# Patient Record
Sex: Male | Born: 1995 | Race: Black or African American | Hispanic: No | Marital: Single | Smoking: Never smoker
Health system: Southern US, Community
[De-identification: ages and names within clinical notes are randomized; demographics above are authoritative.]

---
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
## 2016-11-16 ENCOUNTER — Emergency Department
Admission: EM | Admit: 2016-11-16 | Discharge: 2016-11-17 | Disposition: A | Discharge status: Home / Self Care | Payer: Managed Care, Other (non HMO) | Attending: Emergency Medicine | Admitting: Emergency Medicine

## 2016-11-16 ENCOUNTER — Encounter: Payer: Self-pay | Admitting: Emergency Medicine

## 2016-11-16 ENCOUNTER — Emergency Department: Payer: Managed Care, Other (non HMO)

## 2016-11-16 DIAGNOSIS — Y999 Unspecified external cause status: Secondary | ICD-10-CM | POA: Diagnosis not present

## 2016-11-16 DIAGNOSIS — R4182 Altered mental status, unspecified: Secondary | ICD-10-CM | POA: Diagnosis present

## 2016-11-16 DIAGNOSIS — S060X1A Concussion with loss of consciousness of 30 minutes or less, initial encounter: Secondary | ICD-10-CM | POA: Diagnosis not present

## 2016-11-16 DIAGNOSIS — Y929 Unspecified place or not applicable: Secondary | ICD-10-CM | POA: Diagnosis not present

## 2016-11-16 DIAGNOSIS — W500XXA Accidental hit or strike by another person, initial encounter: Secondary | ICD-10-CM | POA: Diagnosis not present

## 2016-11-16 DIAGNOSIS — Y9367 Activity, basketball: Secondary | ICD-10-CM | POA: Insufficient documentation

## 2016-11-16 LAB — BASIC METABOLIC PANEL
Anion gap: 8 (ref 5–15)
BUN: 26 mg/dL — ABNORMAL HIGH (ref 6–20)
CALCIUM: 9.7 mg/dL (ref 8.9–10.3)
CO2: 25 mmol/L (ref 22–32)
CREATININE: 1.59 mg/dL — AB (ref 0.61–1.24)
Chloride: 105 mmol/L (ref 101–111)
GFR calc Af Amer: >60 mL/min (ref >60)
Glucose, Bld: 102 mg/dL — ABNORMAL HIGH (ref 65–99)
Potassium: 3.3 mmol/L — ABNORMAL LOW (ref 3.5–5.1)
SODIUM: 138 mmol/L (ref 135–145)

## 2016-11-16 LAB — CBC
HCT: 44 % (ref 40.0–52.0)
HEMOGLOBIN: 15.2 g/dL (ref 13.0–18.0)
MCH: 27 pg (ref 26.0–34.0)
MCHC: 34.5 g/dL (ref 32.0–36.0)
MCV: 78.4 fL — ABNORMAL LOW (ref 80.0–100.0)
PLATELETS: 222 10*3/uL (ref 150–440)
RBC: 5.62 MIL/uL (ref 4.40–5.90)
RDW: 13.3 % (ref 11.5–14.5)
WBC: 7.1 10*3/uL (ref 3.8–10.6)

## 2016-11-16 MED ORDER — ACETAMINOPHEN 500 MG PO TABS
1000.0000 mg | ORAL_TABLET | Freq: Once | ORAL | Status: AC
  Administered 2016-11-16: 1000 mg via ORAL
  Filled 2016-11-16: qty 2

## 2016-11-16 MED ORDER — SODIUM CHLORIDE 0.9 % IV BOLUS (SEPSIS)
1000.0000 mL | Freq: Once | INTRAVENOUS | Status: AC
  Administered 2016-11-16: 1000 mL via INTRAVENOUS

## 2016-11-16 MED ORDER — LORAZEPAM 2 MG/ML IJ SOLN
INTRAMUSCULAR | Status: AC
  Filled 2016-11-16: qty 1

## 2016-11-16 NOTE — Discharge Instructions (Addendum)
You were seen in the Emergency Department (ED) today for a head injury.  Based on your evaluation, you may have sustained a concussion (or bruise) to your brain.    Symptoms to expect from a concussion include nausea, mild to moderate headache, difficulty concentrating or sleeping, and mild lightheadedness.  These symptoms should improve over the next few days to weeks, but it may take many weeks before you feel back to normal.  Return to the emergency department or follow-up with your primary care doctor if your symptoms are not improving over this time.  Signs of a more serious head injury include vomiting, severe headache, excessive sleepiness or confusion, and weakness or numbness in your face, arms or legs.  Return immediately to the Emergency Department if you experience any of these more concerning symptoms.    Rest, avoid strenuous physical or mental activity, and avoid activities that could potentially result in another head injury until all your symptoms from this head injury are completely resolved for at least 2-3 weeks.  If you participate in sports, get cleared by your doctor or trainer before returning to play.  You may take ibuprofen or acetaminophen over the counter according to label instructions for mild headache or scalp soreness.  Please seek medical attention for any high fevers, chest pain, shortness of breath, change in behavior, persistent vomiting, bloody stool or any other new or concerning symptoms.

## 2016-11-16 NOTE — ED Provider Notes (Signed)
[facility] Emergency Department Provider Note   ____________________________________________   I have reviewed the triage vital signs and the nursing notes.   HISTORY  Chief Complaint Possible Concussion   History limited by: Altered Mental Status, history obtained from father   HPI Troy Spence is a 21 y.o. male who presents to the emergency department today after sports injury. The patient was playing basketball when he got elbowed in the head. Per the dad the patient went down to the ground and was unconscious for a couple minutes. He then was confused and able to speak normally. Mother states he's been mumbling since the injury. The injury happened about 1-1/2 hours prior to my evaluation. He also states that he was having a hard time walking. Patient himself is unable to give any history.   History reviewed. No pertinent past medical history.  There are no active problems to display for this patient.   History reviewed. No pertinent surgical history.  Prior to Admission medications   Medication Sig Start Date End Date Taking? Authorizing Provider  oxyCODONE-acetaminophen (PERCOCET/ROXICET) 5-325 MG tablet Take 1 tablet by mouth once. 11/10/15   Darci Currentandolph N Brown, MD    Allergies Patient has no known allergies.  History reviewed. No pertinent family history.  Social History Social History  Substance Use Topics  . Smoking status: Never Smoker  . Smokeless tobacco: Never Used  . Alcohol use No    Review of Systems Unable to obtain  ____________________________________________   PHYSICAL EXAM:  VITAL SIGNS: ED Triage Vitals [11/16/16 2119]  Enc Vitals Group     BP 126/76     Pulse 74     Resp 16     Temp 99.1     Temp src      SpO2 99     Weight 200 lb (90.7 kg)     Height 5\' 11"  (1.803 m)   Constitutional: Somnolent, wakes up to verbal stimuli, mouths single words. Eyes: Conjunctivae are normal. Normal extraocular  movements. ENT   Head: Normocephalic and atraumatic. No hematoma. No lacerations.   Nose: No congestion/rhinnorhea.   Mouth/Throat: Mucous membranes are moist.   Neck: No stridor. Hematological/Lymphatic/Immunilogical: No cervical lymphadenopathy. Cardiovascular: Normal rate, regular rhythm.  No murmurs, rubs, or gallops.  Respiratory: Normal respiratory effort without tachypnea nor retractions. Breath sounds are clear and equal bilaterally. No wheezes/rales/rhonchi. Gastrointestinal: Soft and non tender. No rebound. No guarding.  Genitourinary: Deferred Musculoskeletal: Normal range of motion in all extremities. No lower extremity edema. Neurologic:  Somnolent, wakes to verbal stimuli. Moves all extremities. Sensation intact.  Skin:  Skin is warm, dry and intact. No rash noted.   ____________________________________________    LABS (pertinent positives/negatives)  Cr 1.59  ____________________________________________   EKG  None  ____________________________________________    RADIOLOGY  CT head IMPRESSION: No acute intracranial abnormalities.   ____________________________________________   PROCEDURES  Procedures  ____________________________________________   INITIAL IMPRESSION / ASSESSMENT AND PLAN / ED COURSE  Pertinent labs & imaging results that were available during my care of the patient were reviewed by me and considered in my medical decision making (see chart for details).  Patient presented to the emergency department today after head trauma. Patient unable to state name. Will open eyes to verbal stimuli however has a hard time tracking. Given concern for head injury will obtain CT scan.  CT scan negative for acute findings. Blood work concerning for dehydration. ----------------------------------------- 11:36 PM on 11/16/2016 -----------------------------------------  Patient continues to gradually  improve. The emergency  department. He is now able to speak sentences. States he has a mild headache. Will give Tylenol. ____________________________________________   FINAL CLINICAL IMPRESSION(S) / ED DIAGNOSES  Final diagnoses:  Concussion with loss of consciousness of 30 minutes or less, initial encounter     Note: This dictation was prepared with Dragon dictation. Any transcriptional errors that result from this process are unintentional     Troy Semen, MD 11/17/16 1551

## 2016-11-16 NOTE — ED Triage Notes (Signed)
Per pt's dad he was playing basketball and ran into a defending player with his elbows out while trying to put out a screen.  Patient took elbow across the bridge of the nose and became dizzy and lethargic at courtside.  Pt was retrieved from his car by 6 staff members and put onto a stretcher outside and taken to room 3.  MD was present during triage and patient was then taken to CT for head scan, with MD at patient's side.  Pt was unable to initially speak when he arrived here but is becoming more orietned to voice and is currently resting with his eyes closed.

## 2016-11-17 LAB — URINALYSIS, COMPLETE (UACMP) WITH MICROSCOPIC
Bilirubin Urine: NEGATIVE
Glucose, UA: NEGATIVE mg/dL
Hgb urine dipstick: NEGATIVE
Ketones, ur: 5 mg/dL — AB
Leukocytes, UA: NEGATIVE
Nitrite: NEGATIVE
Protein, ur: 30 mg/dL — AB
SPECIFIC GRAVITY, URINE: 1.031 — AB (ref 1.005–1.030)
pH: 5 (ref 5.0–8.0)

## 2016-11-17 LAB — CK: CK TOTAL: 369 U/L (ref 49–397)

## 2016-11-17 MED ORDER — SODIUM CHLORIDE 0.9 % IV BOLUS (SEPSIS)
1000.0000 mL | INTRAVENOUS | Status: AC
  Administered 2016-11-17: 1000 mL via INTRAVENOUS

## 2016-11-17 NOTE — ED Provider Notes (Signed)
Clinical Course as of Nov 17 802  Sat Nov 17, 2016  0002 Assuming care from Dr. Derrill KayGoodman.  In short, Troy Spence is a 21 y.o. male with a chief complaint of head injury.  Refer to the original H&P for additional details.  The current plan of care is to continue to monitor for improvement after his head injury.   [CF]  0308 Patient ambulated to bathroom without difficulty, but his urine is very dark even after 1L NS.  Given his elevated creatinine and dark urine, I will give a second liter bolus and we will send a CK to make sure he is not in rhabdo.  Improving mental status is reassuring.  [CF]  0346 CK normal, patient is feeling better.  Will d/c with his father.  [CF]    Clinical Course User Index [CF] Troy Roseory Jacquese Cassarino, MD      Troy Roseory Ozella Comins, MD 11/17/16 [phone]

## 2018-05-14 ENCOUNTER — Ambulatory Visit (HOSPITAL_COMMUNITY)
Admission: EM | Admit: 2018-05-14 | Discharge: 2018-05-14 | Discharge status: Absent without leave | Payer: Managed Care, Other (non HMO)

## 2018-07-28 ENCOUNTER — Other Ambulatory Visit: Payer: Self-pay

## 2018-07-28 ENCOUNTER — Encounter: Payer: Self-pay | Admitting: Intensive Care

## 2018-07-28 ENCOUNTER — Emergency Department
Admission: EM | Admit: 2018-07-28 | Discharge: 2018-07-28 | Disposition: A | Discharge status: Home / Self Care | Payer: Managed Care, Other (non HMO) | Attending: Emergency Medicine | Admitting: Emergency Medicine

## 2018-07-28 DIAGNOSIS — R21 Rash and other nonspecific skin eruption: Secondary | ICD-10-CM | POA: Insufficient documentation

## 2018-07-28 LAB — GLUCOSE, CAPILLARY: Glucose-Capillary: 101 mg/dL — ABNORMAL HIGH (ref 70–99)

## 2018-07-28 NOTE — ED Triage Notes (Signed)
Patient has swelling of bilateral feet with rashes. Patient also has blisters forming all over body and trunk. He reports having this problem before but unsure what it was diagnosed as

## 2018-07-28 NOTE — Discharge Instructions (Addendum)
Please seek medical attention for any high fevers, chest pain, shortness of breath, change in behavior, persistent vomiting, bloody stool or any other new or concerning symptoms.  

## 2018-07-28 NOTE — ED Provider Notes (Signed)
[facility] Emergency Department Provider Note  _______________________________________   I have reviewed the triage vital signs and the nursing notes.   HISTORY  Chief Complaint Rash   History limited by: Not Limited   HPI Troy Spence is a 22 y.o. male who presents to the emergency department today because of concern for a rash. The patient has had a rash to his bilateral feet, extremities and trunk. His feet have been somewhat painful and swollen although the other lesions have essentially been painless. Went to urgent care where they started him on antifungal medication and steroids. He has seen some improvement in his foot lesions. The patient denies any fevers, nausea or vomiting. He states he has had similar lesions on his feet once before.    History reviewed. No pertinent past medical history.  There are no active problems to display for this patient.   History reviewed. No pertinent surgical history.  Prior to Admission medications   Not on File    Allergies Patient has no known allergies.  History reviewed. No pertinent family history.  Social History Social History   Tobacco Use  . Smoking status: Never Smoker  . Smokeless tobacco: Never Used  Substance Use Topics  . Alcohol use: No  . Drug use: Not on file    Review of Systems Constitutional: No fever/chills Eyes: No visual changes. ENT: No sore throat. Cardiovascular: Denies chest pain. Respiratory: Denies shortness of breath. Gastrointestinal: No abdominal pain.  No nausea, no vomiting.  No diarrhea.   Genitourinary: Negative for dysuria. Musculoskeletal: Negative for back pain. Positive for foot swelling.  Skin: Positive for rash.  Neurological: Negative for headaches, focal weakness or numbness.  ____________________________________________   PHYSICAL EXAM:  VITAL SIGNS: ED Triage Vitals  Enc Vitals Group     BP 07/28/18 1509 (!) 147/68     Pulse Rate  07/28/18 1509 72     Resp 07/28/18 1509 16     Temp 07/28/18 1509 98 F (36.7 C)     Temp Source 07/28/18 1509 Oral     SpO2 07/28/18 1509 98 %     Weight 07/28/18 1509 213 lb (96.6 kg)     Height 07/28/18 1509 5\' 11"  (1.803 m)     Head Circumference --      Peak Flow --      Pain Score 07/28/18 1518 0   Constitutional: Alert and oriented.  Eyes: Conjunctivae are normal.  ENT      Head: Normocephalic and atraumatic.      Nose: No congestion/rhinnorhea.      Mouth/Throat: Mucous membranes are moist.      Neck: No stridor. Hematological/Lymphatic/Immunilogical: No cervical lymphadenopathy. Cardiovascular: Normal rate, regular rhythm.  No murmurs, rubs, or gallops. Respiratory: Normal respiratory effort without tachypnea nor retractions. Breath sounds are clear and equal bilaterally. No wheezes/rales/rhonchi. Gastrointestinal: Soft and non tender. No rebound. No guarding.  Genitourinary: Deferred Musculoskeletal: Normal range of motion in all extremities.  Neurologic:  Normal speech and language. No gross focal neurologic deficits are appreciated.  Skin:  Defuse urticarial rash with some crusting. Larger more confluent rash to the dorsum of the feet, right greater than left. No surrounding erythema.  Psychiatric: Mood and affect are normal. Speech and behavior are normal. Patient exhibits appropriate insight and judgment.  ____________________________________________    LABS (pertinent positives/negatives)  Blood glucose 101  ____________________________________________   EKG  None  ____________________________________________    RADIOLOGY  None  ____________________________________________   PROCEDURES  Procedures  ____________________________________________   INITIAL IMPRESSION / ASSESSMENT AND PLAN / ED COURSE  Pertinent labs & imaging results that were available during my care of the patient were reviewed by me and considered in my medical decision  making (see chart for details).   Patient presented to the emergency department today because of concerns for rash.  In terms of his rash I do wonder if 2 different processes are occurring.  In terms of his foot rash I do think a severe case of athlete's foot is possible.  It does appear that it is responding to the medication.  However for the urticarial rash I am not so sure.  Does have some lesions on his palms however he denies any sexual activity in the past year.  Patient denies any other systemic illness that would make me concerned for secondary syphilis.  Discussed with the patient.  At this point unclear if any blood work here in the emergency department would be of value.  Do think he would benefit from dermatology evaluation.  Discussed this with the patient.  Will give dermatology follow-up.  ____________________________________________   FINAL CLINICAL IMPRESSION(S) / ED DIAGNOSES  Final diagnoses:  Rash     Note: This dictation was prepared with Dragon dictation. Any transcriptional errors that result from this process are unintentional     [name], MD 07/28/18 1934

## 2018-07-28 NOTE — ED Notes (Signed)

## 2020-06-22 ENCOUNTER — Emergency Department: Payer: Managed Care, Other (non HMO)

## 2020-06-22 ENCOUNTER — Emergency Department
Admission: EM | Admit: 2020-06-22 | Discharge: 2020-06-22 | Disposition: A | Discharge status: Home / Self Care | Payer: Managed Care, Other (non HMO) | Attending: Emergency Medicine | Admitting: Emergency Medicine

## 2020-06-22 ENCOUNTER — Other Ambulatory Visit: Payer: Self-pay

## 2020-06-22 DIAGNOSIS — Y9289 Other specified places as the place of occurrence of the external cause: Secondary | ICD-10-CM | POA: Insufficient documentation

## 2020-06-22 DIAGNOSIS — S161XXA Strain of muscle, fascia and tendon at neck level, initial encounter: Secondary | ICD-10-CM | POA: Insufficient documentation

## 2020-06-22 DIAGNOSIS — S199XXA Unspecified injury of neck, initial encounter: Secondary | ICD-10-CM | POA: Diagnosis present
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
## 2021-02-07 ENCOUNTER — Other Ambulatory Visit: Payer: Self-pay | Admitting: Family Medicine

## 2021-02-07 ENCOUNTER — Other Ambulatory Visit: Payer: Self-pay

## 2021-02-07 ENCOUNTER — Ambulatory Visit: Payer: Self-pay

## 2021-02-07 DIAGNOSIS — M25531 Pain in right wrist: Secondary | ICD-10-CM

## 2021-10-28 ENCOUNTER — Emergency Department
Admission: EM | Admit: 2021-10-28 | Discharge: 2021-10-29 | Disposition: A | Discharge status: Home / Self Care | Payer: Managed Care, Other (non HMO) | Attending: Emergency Medicine | Admitting: Emergency Medicine

## 2021-10-28 DIAGNOSIS — R0602 Shortness of breath: Secondary | ICD-10-CM | POA: Insufficient documentation

## 2021-10-28 DIAGNOSIS — R55 Syncope and collapse: Secondary | ICD-10-CM | POA: Insufficient documentation

## 2021-10-28 DIAGNOSIS — R42 Dizziness and giddiness: Secondary | ICD-10-CM | POA: Diagnosis present

## 2021-10-29 ENCOUNTER — Encounter: Payer: Self-pay | Admitting: Radiology

## 2021-10-29 ENCOUNTER — Other Ambulatory Visit: Payer: Self-pay

## 2021-10-29 ENCOUNTER — Emergency Department: Payer: Managed Care, Other (non HMO)

## 2021-10-29 LAB — CBC WITH DIFFERENTIAL/PLATELET
Abs Immature Granulocytes: 0.02 10*3/uL (ref 0.00–0.07)
Basophils Absolute: 0 10*3/uL (ref 0.0–0.1)
Basophils Relative: 1 %
Eosinophils Absolute: 0.1 10*3/uL (ref 0.0–0.5)
Eosinophils Relative: 2 %
HCT: 40.4 % (ref 39.0–52.0)
Hemoglobin: 14 g/dL (ref 13.0–17.0)
Immature Granulocytes: 1 %
Lymphocytes Relative: 41 %
Lymphs Abs: 1.7 10*3/uL (ref 0.7–4.0)
MCH: 28.3 pg (ref 26.0–34.0)
MCHC: 34.7 g/dL (ref 30.0–36.0)
MCV: 81.6 fL (ref 80.0–100.0)
Monocytes Absolute: 0.3 10*3/uL (ref 0.1–1.0)
Monocytes Relative: 7 %
Neutro Abs: 2.1 10*3/uL (ref 1.7–7.7)
Neutrophils Relative %: 48 %
Platelets: 203 10*3/uL (ref 150–400)
RBC: 4.95 MIL/uL (ref 4.22–5.81)
RDW: 12.9 % (ref 11.5–15.5)
WBC: 4.3 10*3/uL (ref 4.0–10.5)
nRBC: 0 % (ref 0.0–0.2)

## 2021-10-29 LAB — COMPREHENSIVE METABOLIC PANEL
ALT: 16 U/L (ref 0–44)
AST: 16 U/L (ref 15–41)
Albumin: 3.8 g/dL (ref 3.5–5.0)
Alkaline Phosphatase: 69 U/L (ref 38–126)
Anion gap: 4 — ABNORMAL LOW (ref 5–15)
BUN: 16 mg/dL (ref 6–20)
CO2: 25 mmol/L (ref 22–32)
Calcium: 8.9 mg/dL (ref 8.9–10.3)
Chloride: 107 mmol/L (ref 98–111)
Creatinine, Ser: 1.01 mg/dL (ref 0.61–1.24)
GFR, Estimated: >60 mL/min (ref >60)
Glucose, Bld: 104 mg/dL — ABNORMAL HIGH (ref 70–99)
Potassium: 3.7 mmol/L (ref 3.5–5.1)
Sodium: 136 mmol/L (ref 135–145)
Total Bilirubin: 0.6 mg/dL (ref 0.3–1.2)
Total Protein: 6.4 g/dL — ABNORMAL LOW (ref 6.5–8.1)

## 2021-10-29 LAB — TROPONIN I (HIGH SENSITIVITY)
Troponin I (High Sensitivity): <2 ng/L (ref <18)
Troponin I (High Sensitivity): 3 ng/L (ref <18)

## 2021-10-29 LAB — D-DIMER, QUANTITATIVE: D-Dimer, Quant: <0.27 ug/mL-FEU (ref 0.00–0.50)

## 2021-10-29 LAB — CBG MONITORING, ED: Glucose-Capillary: 103 mg/dL — ABNORMAL HIGH (ref 70–99)

## 2021-10-29 MED ORDER — SODIUM CHLORIDE 0.9 % IV BOLUS (SEPSIS)
1000.0000 mL | Freq: Once | INTRAVENOUS | Status: AC
Start: 2021-10-29 — End: 2021-10-29
  Administered 2021-10-29: 1000 mL via INTRAVENOUS

## 2021-10-29 NOTE — ED Notes (Signed)
Pr requested to use toilet in room to urinate and needed ambulation trial per EDP request. Pt was able to ambulate to toilet in room- denied any dizziness, lightheadedness, etc. Pt sat on side of bed for approx 30 seconds and then stood approx 30 seconds. Pt was steady on feet and only needed mild assistance to toilet and then was able to walk back to bed unassisted and with steady gait. Pt reported he is "feeling a lot better." EDP updated. Pt also reported he does not live alone and his dad is at home if he needed assistance.

## 2021-10-29 NOTE — ED Notes (Addendum)
Pt requested apple sauce and apple juice when informed he could try eating and drinking per EDP.

## 2021-10-29 NOTE — ED Provider Notes (Signed)
[facility] Provider Note    Event Date/Time   First MD Initiated Contact with Patient 10/29/21 0002     (approximate)   History   Seizures   HPI  Troy Spence is a 26 y.o. male with no significant past medical history who presents to the emergency department with EMS after he had a syncopal event at work.  Patient states this is happened to him once before when he was working and got "overheated".  He states that he was standing and suddenly felt lightheaded and short of breath.  He passed out but denies any injury from his fall.  No headache, neck or back pain.  No numbness, tingling or weakness.  Not on blood thinners.  He denies having any chest pain prior to the syncopal event or afterwards.  No witnessed seizure-like activity.  He was not postictal.  Normal blood glucose and vital signs with EMS.  Patient denies any bloody stools or melena.  Reports he has been eating and drinking well.  No fevers, cough, vomiting or diarrhea.  No previous cardiac history.  No history of PE or DVT.   History provided by patient, EMS.    History reviewed. No pertinent past medical history.  No past surgical history on file.  MEDICATIONS:  Prior to Admission medications   Not on File    Physical Exam   Triage Vital Signs: ED Triage Vitals [10/28/21 2357]  Enc Vitals Group     BP 123/86     Pulse Rate 67     Resp 18     Temp 98.6 F (37 C)     Temp src      SpO2 97 %     Weight 236 lb (107 kg)     Height 6' (1.829 m)     Head Circumference      Peak Flow      Pain Score      Pain Loc      Pain Edu?      Excl. in [place]?     Most recent vital signs: Vitals:   10/29/21 0130 10/29/21 0200  BP: 110/74 110/71  Pulse: (!) 57 (!) 57  Resp: 15 15  Temp:    SpO2: 94% 95%    CONSTITUTIONAL: Alert and oriented and responds appropriately to questions. Well-appearing; well-nourished HEAD: Normocephalic, atraumatic EYES: Conjunctivae clear, pupils appear  equal, sclera nonicteric ENT: normal nose; moist mucous membranes NECK: Supple, normal ROM, no midline spinal tenderness or step-off or deformity CARD: RRR; S1 and S2 appreciated; no murmurs, no clicks, no rubs, no gallops RESP: Normal chest excursion without splinting or tachypnea; breath sounds clear and equal bilaterally; no wheezes, no rhonchi, no rales, no hypoxia or respiratory distress, speaking full sentences ABD/GI: Normal bowel sounds; non-distended; soft, non-tender, no rebound, no guarding, no peritoneal signs BACK: The back appears normal, no midline spinal tenderness or step-off or deformity EXT: Normal ROM in all joints; no deformity noted, no edema; no cyanosis SKIN: Normal color for age and race; warm; no rash on exposed skin NEURO: Moves all extremities equally, normal speech, normal sensation diffusely, no facial asymmetry PSYCH: The patient's mood and manner are appropriate.   ED Results / Procedures / Treatments   LABS: (all labs ordered are listed, but only abnormal results are displayed) Labs Reviewed  COMPREHENSIVE METABOLIC PANEL - Abnormal; Notable for the following components:      Result Value   Glucose, Bld 104 (*)  Total Protein 6.4 (*)    Anion gap 4 (*)    All other components within normal limits  CBG MONITORING, ED - Abnormal; Notable for the following components:   Glucose-Capillary 103 (*)    All other components within normal limits  CBC WITH DIFFERENTIAL/PLATELET  D-DIMER, QUANTITATIVE (NOT AT [facility])  TROPONIN I (HIGH SENSITIVITY)  TROPONIN I (HIGH SENSITIVITY)     EKG:  EKG Interpretation  Date/Time:  Saturday October 28 2021 23:56:27 EST Ventricular Rate:  57 PR Interval:  204 QRS Duration: 94 QT Interval:  408 QTC Calculation: 398 R Axis:   91 Text Interpretation: Sinus rhythm Borderline prolonged PR interval Borderline right axis deviation Borderline T wave abnormalities ST elevation, consider lateral injury Confirmed by Pryor Curia [phone]) on 10/29/2021 12:14:52 AM         RADIOLOGY: My personal review and interpretation of imaging: Chest x-ray shows no infiltrate, edema, cardiomegaly, widened mediastinum, pneumothorax.  I have personally reviewed all radiology reports.   DG Chest 2 View  Result Date: 10/29/2021 CLINICAL DATA:  Shortness of breath EXAM: CHEST - 2 VIEW COMPARISON:  None. FINDINGS: The heart size and mediastinal contours are within normal limits. Both lungs are clear. The visualized skeletal structures are unremarkable. IMPRESSION: No active cardiopulmonary disease. Electronically Signed   By: Ulyses Jarred M.D.   On: 10/29/2021 03:13     PROCEDURES:  Critical Care performed: No     .1-3 Lead EKG Interpretation Performed by: Fernande Treiber, Delice Bison, DO Authorized by: Myrella Fahs, Delice Bison, DO     Interpretation: normal     ECG rate:  60   ECG rate assessment: normal     Rhythm: sinus rhythm     Ectopy: none     Conduction: normal      IMPRESSION / MDM / ASSESSMENT AND PLAN / ED COURSE  I reviewed the triage vital signs and the nursing notes.    Patient here after a syncopal event.  Felt lightheaded and short of breath prior to passing out.  No witnessed seizure activity.  The patient is on the cardiac monitor to evaluate for evidence of arrhythmia and/or significant heart rate changes.   DIFFERENTIAL DIAGNOSIS (includes but not limited to):   Vasovagal syncope, less likely ACS, PE, dissection, pneumothorax, CHF, pneumonia.  Differential also includes anemia, electrolyte derangement, dehydration, orthostasis.  Episode today does not sound like a seizure.   PLAN: CBC, CMP, troponin, D-dimer, chest x-ray, EKG, IV fluids.   MEDICATIONS GIVEN IN ED: Medications  sodium chloride 0.9 % bolus 1,000 mL (0 mLs Intravenous Stopped 10/29/21 0220)     ED COURSE: Patient's labs are reassuring.  Normal hemoglobin, electrolytes.  Troponin negative x2.  D-dimer negative.  Chest x-ray reviewed by  myself and radiologist is clear without edema, infiltrate, pneumothorax.  EKG shows no significant abnormality.  I do not appreciate any ischemia, interval abnormality or arrhythmia.  He has been monitored on cardiac monitoring without any acute events.  Has been in only bradycardic into the 50s which I suspect is his baseline.  He has been able to tolerate p.o. here and ambulate without difficulty.  I feel he is safe for discharge home with close outpatient follow-up.  Patient comfortable with this plan.  I suspect today's episode was vasovagal in nature.   At this time, I do not feel there is any life-threatening condition present. I reviewed all nursing notes, vitals, pertinent previous records.  All lab and urine results, EKGs, imaging ordered have  been independently reviewed and interpreted by myself.  I reviewed all available radiology reports from any imaging ordered this visit.  Based on my assessment, I feel the patient is safe to be discharged home without further emergent workup and can continue workup as an outpatient as needed. Discussed all findings, treatment plan as well as usual and customary return precautions with patient.  They verbalize understanding and are comfortable with this plan.  Outpatient follow-up has been provided as needed.  All questions have been answered.    CONSULTS: No admission needed to the hospital at this time given hemodynamically stable with no cardiac arrhythmia, troponin negative x2, normal D-dimer.   OUTSIDE RECORDS REVIEWED: Reviewed patient's most recent primary care note on 06/15/2020.         FINAL CLINICAL IMPRESSION(S) / ED DIAGNOSES   Final diagnoses:  Syncope, unspecified syncope type     Rx / DC Orders   ED Discharge Orders     None        Note:  This document was prepared using Dragon voice recognition software and may include unintentional dictation errors.   Conard Alvira, Delice Bison, DO 10/29/21 [phone]

## 2021-10-29 NOTE — ED Triage Notes (Signed)
Pt arrived via [facility] from work. Was reported that pt was dizzy and fell. When fire dept/EMS arrived pt was described as possibly "post-ictal" due to being disoriented. No history of seizures. Pt had episodes of bradycardia en route and HR dropped into 50's. Vitals: HR 62 bpm, BP 142/84, SPO2 99% on room air. Glucose 78. 22g peripheral IV placed in right hand and 500 ml 0.9% NaCl bolus administered.  Pt reports has had episode similar to this a few years ago. No history of seizures per patient. Pt A & O x 4 but speaks quietly and mildly confused and disoriented.

## 2021-10-29 NOTE — ED Notes (Signed)
Per lab, green and blue hemolyzed- needs recollect. Requested lab to pull blood so would not be delayed and since nurse unable to collect successfully from peripheral IV.

## 2023-06-28 ENCOUNTER — Other Ambulatory Visit: Payer: Self-pay

## 2023-06-28 ENCOUNTER — Emergency Department: Payer: Self-pay

## 2023-06-28 ENCOUNTER — Emergency Department
Admission: EM | Admit: 2023-06-28 | Discharge: 2023-06-28 | Disposition: A | Discharge status: Home / Self Care | Payer: Self-pay | Attending: Emergency Medicine | Admitting: Emergency Medicine

## 2023-06-28 DIAGNOSIS — Y92410 Unspecified street and highway as the place of occurrence of the external cause: Secondary | ICD-10-CM | POA: Insufficient documentation

## 2023-06-28 DIAGNOSIS — S161XXA Strain of muscle, fascia and tendon at neck level, initial encounter: Secondary | ICD-10-CM | POA: Diagnosis not present

## 2023-06-28 DIAGNOSIS — M79609 Pain in unspecified limb: Secondary | ICD-10-CM

## 2023-06-28 DIAGNOSIS — S199XXA Unspecified injury of neck, initial encounter: Secondary | ICD-10-CM | POA: Diagnosis present

## 2023-06-28 MED ORDER — METHOCARBAMOL 500 MG PO TABS: 500.0000 mg | ORAL_TABLET | Freq: Four times a day (QID) | ORAL | 0 refills | Status: DC | PRN

## 2023-06-28 MED ORDER — NAPROXEN 500 MG PO TABS: 500.0000 mg | ORAL_TABLET | Freq: Two times a day (BID) | ORAL | 0 refills | Status: DC
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
## 2023-06-28 NOTE — Discharge Instructions (Addendum)
Follow-up with your primary care or if any continued problems or concerns.  Prescription for naproxen and methocarbamol was sent to the pharmacy for you to take as needed for soreness or for muscle spasms.  You may also use ice or heat to your neck and shoulder as needed for discomfort.  Do not drive or operate machinery while taking the methocarbamol as it could cause drowsiness and increase your risk for injury.

## 2023-07-16 IMAGING — CR DG CHEST 2V
2 series · 2 of 2 positions shown · non-contrast
Comparison: None.

CLINICAL DATA: Shortness of breath

EXAM:
CHEST - 2 VIEW

[chest pa]
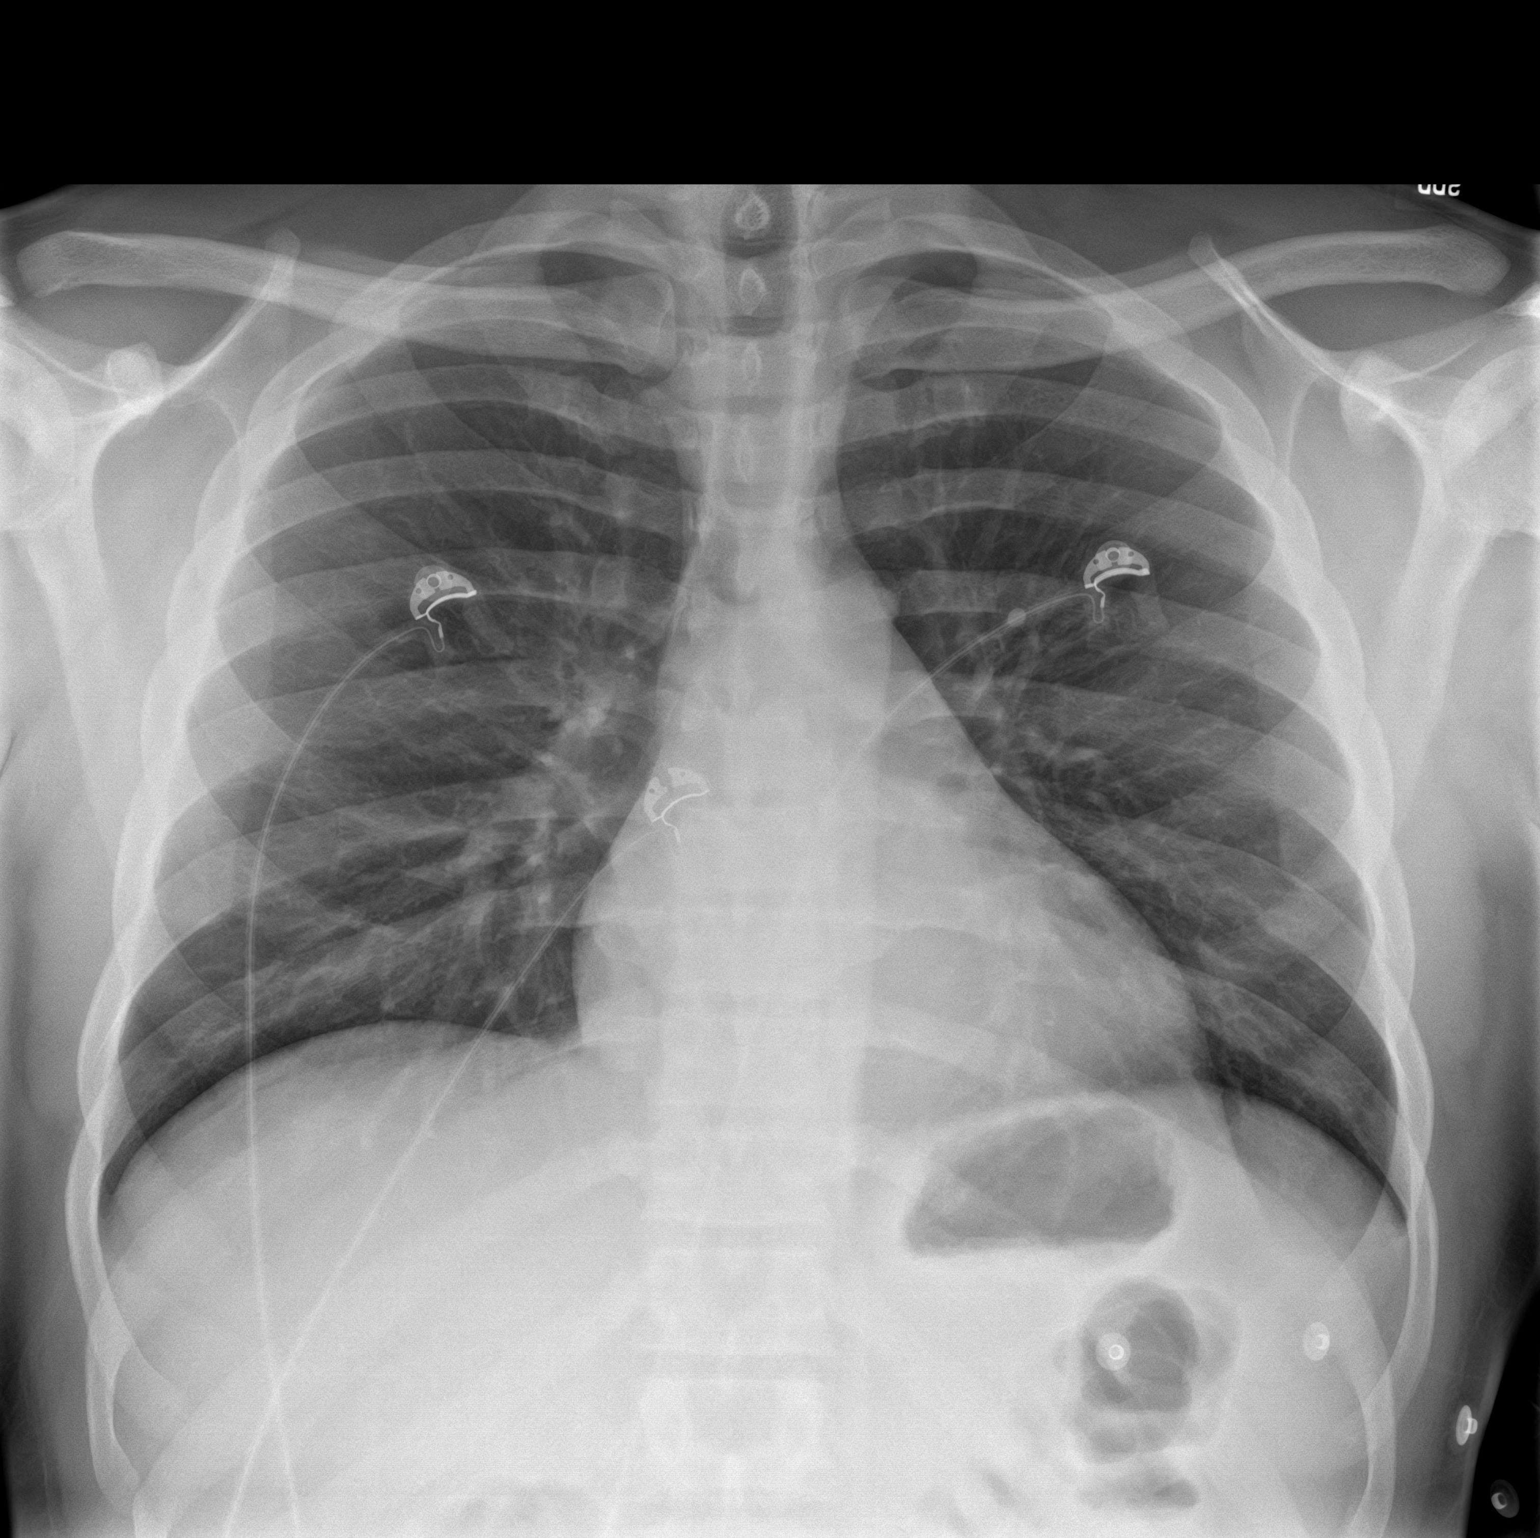

[chest lat]
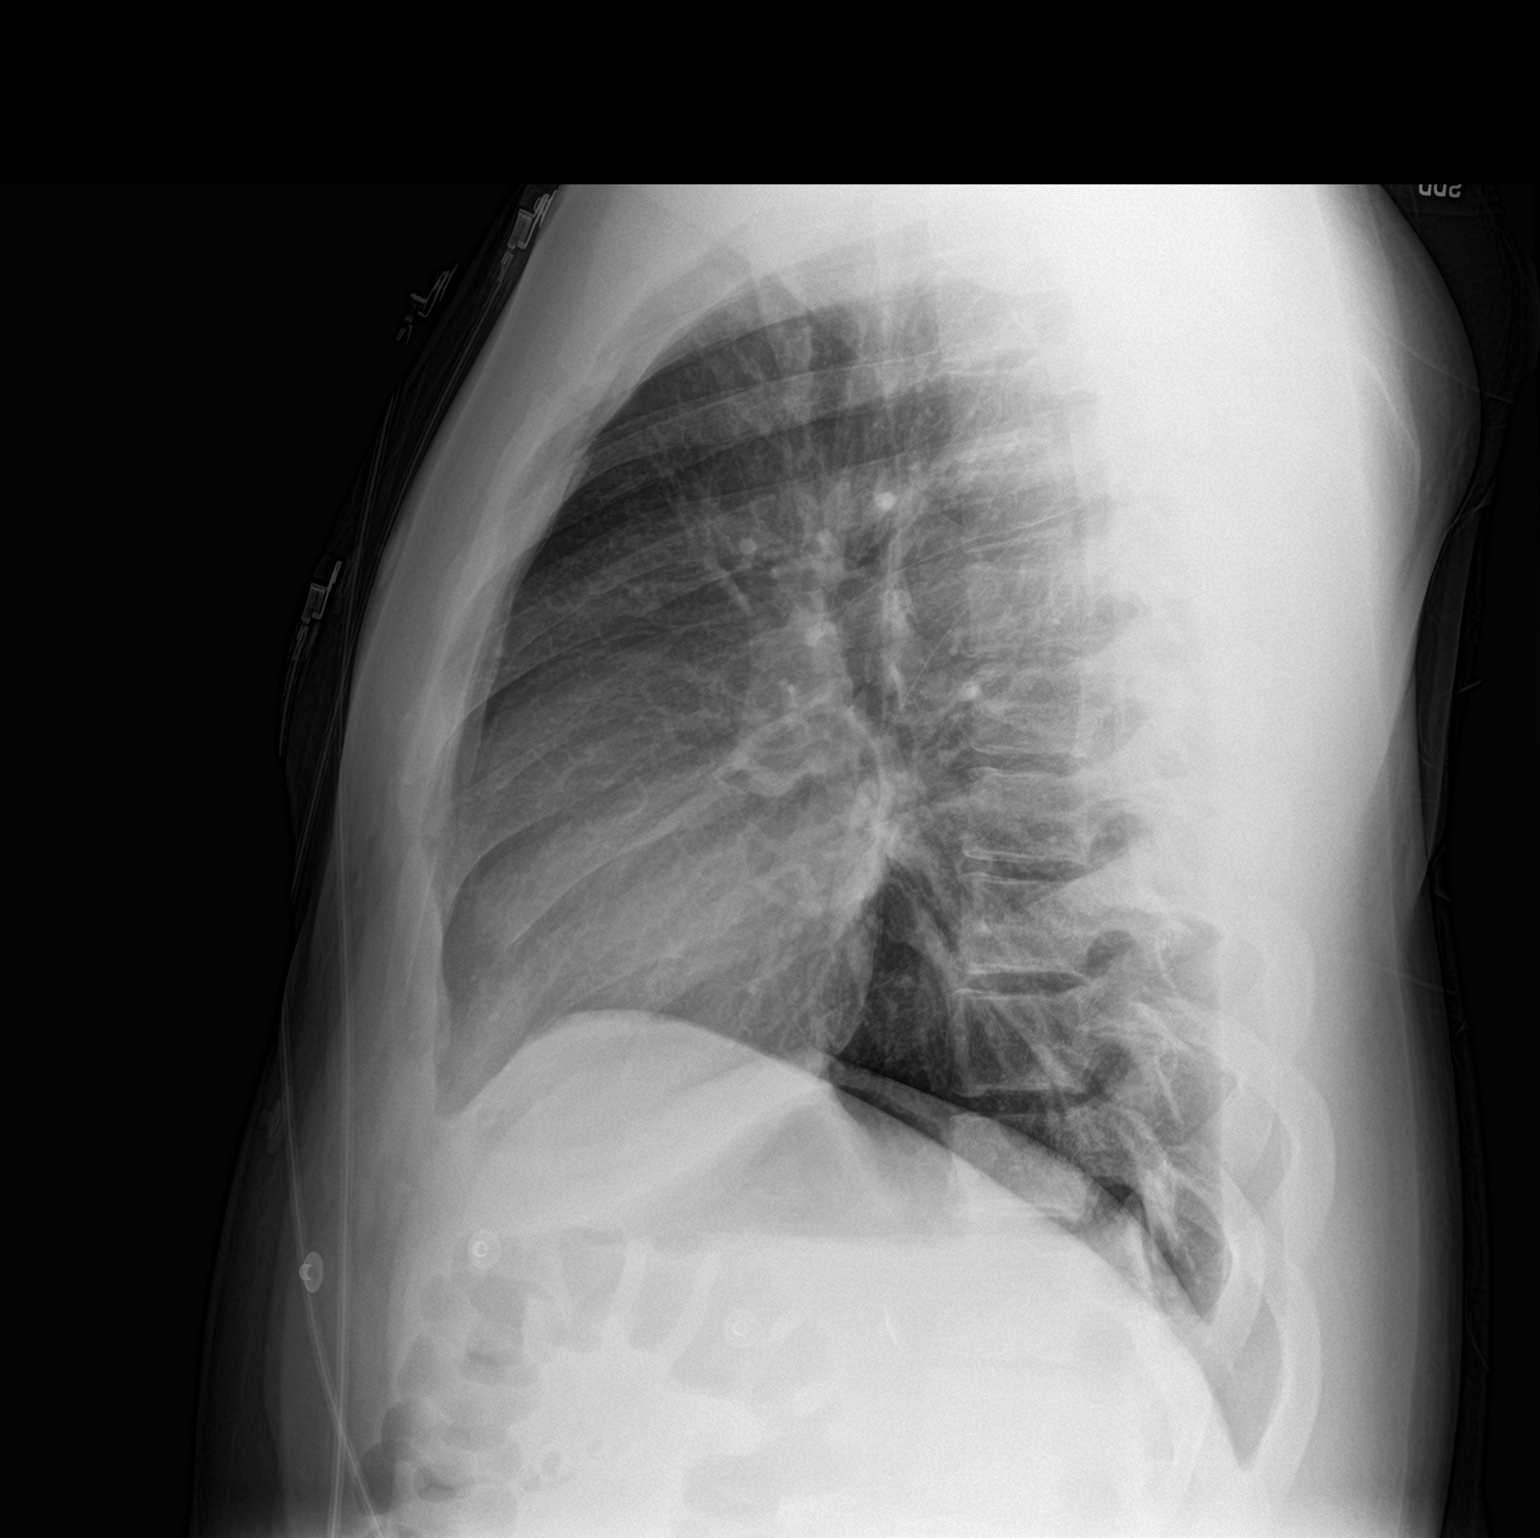

[2 of 2 positions shown; findings below may reference images not displayed]

FINDINGS: The heart size and mediastinal contours are within normal limits.
Both lungs are clear. The visualized skeletal structures are
unremarkable.
IMPRESSION: No active cardiopulmonary disease.

## 2024-04-15 ENCOUNTER — Other Ambulatory Visit: Payer: Self-pay

## 2024-04-15 ENCOUNTER — Emergency Department
Admission: EM | Admit: 2024-04-15 | Discharge: 2024-04-15 | Disposition: A | Discharge status: Home / Self Care | Payer: Self-pay | Attending: Emergency Medicine | Admitting: Emergency Medicine

## 2024-04-15 DIAGNOSIS — M6282 Rhabdomyolysis: Secondary | ICD-10-CM | POA: Insufficient documentation

## 2024-04-15 LAB — COMPREHENSIVE METABOLIC PANEL WITH GFR
ALT: 35 U/L (ref 0–44)
AST: 36 U/L (ref 15–41)
Albumin: 4.1 g/dL (ref 3.5–5.0)
Alkaline Phosphatase: 68 U/L (ref 38–126)
Anion gap: 11 (ref 5–15)
BUN: 21 mg/dL — ABNORMAL HIGH (ref 6–20)
CO2: 23 mmol/L (ref 22–32)
Calcium: 9 mg/dL (ref 8.9–10.3)
Chloride: 105 mmol/L (ref 98–111)
Creatinine, Ser: 1.17 mg/dL (ref 0.61–1.24)
GFR, Estimated: >60 mL/min (ref >60)
Glucose, Bld: 104 mg/dL — ABNORMAL HIGH (ref 70–99)
Potassium: 4.1 mmol/L (ref 3.5–5.1)
Sodium: 139 mmol/L (ref 135–145)
Total Bilirubin: 1.1 mg/dL (ref 0.0–1.2)
Total Protein: 6.8 g/dL (ref 6.5–8.1)

## 2024-04-15 LAB — CK: Total CK: 709 U/L — ABNORMAL HIGH (ref 49–397)

## 2024-04-15 LAB — URINALYSIS, ROUTINE W REFLEX MICROSCOPIC
Bilirubin Urine: NEGATIVE
Glucose, UA: NEGATIVE mg/dL
Ketones, ur: NEGATIVE mg/dL
Nitrite: NEGATIVE
Protein, ur: 100 mg/dL — AB
RBC / HPF: >50 RBC/hpf (ref 0–5)
Specific Gravity, Urine: 1.027 (ref 1.005–1.030)
Squamous Epithelial / HPF: 0 /HPF (ref 0–5)
WBC, UA: >50 WBC/hpf (ref 0–5)
pH: 5 (ref 5.0–8.0)

## 2024-04-15 LAB — CBC WITH DIFFERENTIAL/PLATELET
Abs Immature Granulocytes: 0.02 K/uL (ref 0.00–0.07)
Basophils Absolute: 0 K/uL (ref 0.0–0.1)
Basophils Relative: 1 %
Eosinophils Absolute: 0.2 K/uL (ref 0.0–0.5)
Eosinophils Relative: 3 %
HCT: 44.5 % (ref 39.0–52.0)
Hemoglobin: 15.3 g/dL (ref 13.0–17.0)
Immature Granulocytes: 0 %
Lymphocytes Relative: 53 %
Lymphs Abs: 2.5 K/uL (ref 0.7–4.0)
MCH: 27.1 pg (ref 26.0–34.0)
MCHC: 34.4 g/dL (ref 30.0–36.0)
MCV: 78.8 fL — ABNORMAL LOW (ref 80.0–100.0)
Monocytes Absolute: 0.4 K/uL (ref 0.1–1.0)
Monocytes Relative: 7 %
Neutro Abs: 1.7 K/uL (ref 1.7–7.7)
Neutrophils Relative %: 36 %
Platelets: 202 K/uL (ref 150–400)
RBC: 5.65 MIL/uL (ref 4.22–5.81)
RDW: 12.8 % (ref 11.5–15.5)
WBC: 4.8 K/uL (ref 4.0–10.5)
nRBC: 0 % (ref 0.0–0.2)

## 2024-04-15 MED ORDER — SODIUM CHLORIDE 0.9 % IV BOLUS: 1000.0000 mL | Freq: Once | INTRAVENOUS | Status: DC

## 2024-04-15 MED ORDER — SODIUM CHLORIDE 0.9 % IV BOLUS
2000.0000 mL | Freq: Once | INTRAVENOUS | Status: AC
  Administered 2024-04-15: 2000 mL via INTRAVENOUS

## 2024-04-15 NOTE — ED Notes (Signed)
 Patient states that he has noticed blood in his urine at the end of his stream off and on the past 2-3 weeks. States that the past few days he has noticed dark red blood at the end of his stream with no pain, or discharge reported.

## 2024-04-15 NOTE — ED Triage Notes (Signed)
 Pt reports he has noticed his urine to be pink in color for the past 2 weeks but today noticed he developed dark red urine. Pt denies pain with urination or lower back/abd pain. Pt denies blood thinner use, pt denies injury/trauma to area.
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
## 2024-04-15 NOTE — ED Provider Notes (Signed)
 [facility] Provider Note    Event Date/Time   First MD Initiated Contact with Patient 04/15/24 0719     (approximate)   History   Hematuria   HPI  Troy Spence is a 28 y.o. male who works in [employer] presents emergency department with dark urine.  Patient states over the last 2 weeks his urine has become dark and has had some blood dripping from the penis.  No history of kidney stones.  No concerns of STD.  No fever or chills.  Patient states he has been sweating quite a bit out of the job as they are working outside.  Denies muscle aches today.      Physical Exam   Triage Vital Signs: ED Triage Vitals  Encounter Vitals Group     BP 04/15/24 0649 (!) 137/99     Girls Systolic BP Percentile --      Girls Diastolic BP Percentile --      Boys Systolic BP Percentile --      Boys Diastolic BP Percentile --      Pulse Rate 04/15/24 0649 71     Resp 04/15/24 0649 18     Temp 04/15/24 0649 97.8 F (36.6 C)     Temp src --      SpO2 04/15/24 0649 100 %     Weight 04/15/24 0648 240 lb (108.9 kg)     Height 04/15/24 0648 6' (1.829 m)     Head Circumference --      Peak Flow --      Pain Score 04/15/24 0648 0     Pain Loc --      Pain Education --      Exclude from Growth Chart --     Most recent vital signs: Vitals:   04/15/24 0649  BP: (!) 137/99  Pulse: 71  Resp: 18  Temp: 97.8 F (36.6 C)  SpO2: 100%     General: Awake, no distress.   CV:  Good peripheral perfusion.  Resp:  Normal effort. Abd:  No distention.   Other:     ED Results / Procedures / Treatments   Labs (all labs ordered are listed, but only abnormal results are displayed) Labs Reviewed  URINALYSIS, ROUTINE W REFLEX MICROSCOPIC - Abnormal; Notable for the following components:      Result Value   Color, Urine YELLOW (*)    APPearance CLOUDY (*)    Hgb urine dipstick LARGE (*)    Protein, ur 100 (*)    Leukocytes,Ua SMALL (*)    Bacteria, UA RARE (*)     All other components within normal limits  COMPREHENSIVE METABOLIC PANEL WITH GFR - Abnormal; Notable for the following components:   Glucose, Bld 104 (*)    BUN 21 (*)    All other components within normal limits  CBC WITH DIFFERENTIAL/PLATELET - Abnormal; Notable for the following components:   MCV 78.8 (*)    All other components within normal limits  CK - Abnormal; Notable for the following components:   Total CK 709 (*)    All other components within normal limits     EKG     RADIOLOGY     PROCEDURES:   Procedures  Critical Care:  no Chief Complaint  Patient presents with   Hematuria      MEDICATIONS ORDERED IN ED: Medications  sodium chloride  0.9 % bolus 2,000 mL (0 mLs Intravenous Stopped 04/15/24 0958)     IMPRESSION /  MDM / ASSESSMENT AND PLAN / ED COURSE  I reviewed the triage vital signs and the nursing notes.                              Differential diagnosis includes, but is not limited to, UTI, kidney stone, pyelonephritis, hepatitis, rhabdomyolysis  Patient's presentation is most consistent with acute illness / injury with system symptoms.    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  Go ahead and do labs, evaluate if patient will need fluids etc.   Patient CK elevated at 709, urinalysis has large amount of hemoglobin and protein, remainder his labs are reassuring  I did explain these findings to the patient.  I feel his rhabdomyolysis and kidney damage is due to heat exposure.  He has not been drinking enough water while working in the heat.  Feel that at this time be prudent to keep him out of work until Monday.  That way he can rest and rehydrate.  Strict instructions that when he returns to work he should drink much more than he is drinking now.  He is to follow-up with his regular doctor if not improving in 2 to 3 days.   Return to emergency department if he is worsening.  He is in agreement treatment plan.  Discharged stable condition.   FINAL CLINICAL IMPRESSION(S) / ED DIAGNOSES   Final diagnoses:  Non-traumatic rhabdomyolysis     Rx / DC Orders   ED Discharge Orders     None        Note:  This document was prepared using Dragon voice recognition software and may include unintentional dictation errors.    Gasper Devere ORN, PA-C 04/15/24 1120    Waymond Lorelle Cummins, MD 04/15/24 [phone]

## 2024-05-04 ENCOUNTER — Encounter: Payer: Self-pay | Admitting: Emergency Medicine

## 2024-05-04 ENCOUNTER — Other Ambulatory Visit: Payer: Self-pay

## 2024-05-04 DIAGNOSIS — L03115 Cellulitis of right lower limb: Secondary | ICD-10-CM | POA: Insufficient documentation

## 2024-05-04 NOTE — ED Triage Notes (Signed)
 Pt presents to the ED via POV with complaints of R calf pain that started 3 days ago. Superficial redness with associated swelling visualized to the R lateral calf - no pain meds taken PTA. A&Ox4 at this time. Denies  fevers, chills, CP or SOB.

## 2024-05-05 ENCOUNTER — Emergency Department: Payer: Self-pay

## 2024-05-05 ENCOUNTER — Emergency Department
Admission: EM | Admit: 2024-05-05 | Discharge: 2024-05-05 | Disposition: A | Discharge status: Home / Self Care | Payer: Self-pay | Attending: Emergency Medicine | Admitting: Emergency Medicine

## 2024-05-05 DIAGNOSIS — L03115 Cellulitis of right lower limb: Secondary | ICD-10-CM

## 2024-05-05 LAB — CBC WITH DIFFERENTIAL/PLATELET
Abs Immature Granulocytes: 0.02 K/uL (ref 0.00–0.07)
Basophils Absolute: 0 K/uL (ref 0.0–0.1)
Basophils Relative: 0 %
Eosinophils Absolute: 0.1 K/uL (ref 0.0–0.5)
Eosinophils Relative: 1 %
HCT: 41.4 % (ref 39.0–52.0)
Hemoglobin: 14.4 g/dL (ref 13.0–17.0)
Immature Granulocytes: 0 %
Lymphocytes Relative: 26 %
Lymphs Abs: 2.3 K/uL (ref 0.7–4.0)
MCH: 27.4 pg (ref 26.0–34.0)
MCHC: 34.8 g/dL (ref 30.0–36.0)
MCV: 78.9 fL — ABNORMAL LOW (ref 80.0–100.0)
Monocytes Absolute: 0.7 K/uL (ref 0.1–1.0)
Monocytes Relative: 8 %
Neutro Abs: 5.9 K/uL (ref 1.7–7.7)
Neutrophils Relative %: 65 %
Platelets: 171 K/uL (ref 150–400)
RBC: 5.25 MIL/uL (ref 4.22–5.81)
RDW: 13 % (ref 11.5–15.5)
WBC: 9.1 K/uL (ref 4.0–10.5)
nRBC: 0 % (ref 0.0–0.2)

## 2024-05-05 LAB — COMPREHENSIVE METABOLIC PANEL WITH GFR
ALT: 26 U/L (ref 0–44)
AST: 34 U/L (ref 15–41)
Albumin: 4 g/dL (ref 3.5–5.0)
Alkaline Phosphatase: 55 U/L (ref 38–126)
Anion gap: 12 (ref 5–15)
BUN: 20 mg/dL (ref 6–20)
CO2: 21 mmol/L — ABNORMAL LOW (ref 22–32)
Calcium: 9.3 mg/dL (ref 8.9–10.3)
Chloride: 104 mmol/L (ref 98–111)
Creatinine, Ser: 1.48 mg/dL — ABNORMAL HIGH (ref 0.61–1.24)
GFR, Estimated: >60 mL/min (ref >60)
Glucose, Bld: 163 mg/dL — ABNORMAL HIGH (ref 70–99)
Potassium: 3.5 mmol/L (ref 3.5–5.1)
Sodium: 137 mmol/L (ref 135–145)
Total Bilirubin: 1.6 mg/dL — ABNORMAL HIGH (ref 0.0–1.2)
Total Protein: 7.8 g/dL (ref 6.5–8.1)

## 2024-05-05 MED ORDER — DOXYCYCLINE HYCLATE 100 MG PO TABS
100.0000 mg | ORAL_TABLET | Freq: Once | ORAL | Status: AC
  Administered 2024-05-05: 100 mg via ORAL
  Filled 2024-05-05: qty 1

## 2024-05-05 MED ORDER — CEPHALEXIN 500 MG PO CAPS
500.0000 mg | ORAL_CAPSULE | Freq: Once | ORAL | Status: AC
  Administered 2024-05-05: 500 mg via ORAL
  Filled 2024-05-05: qty 1

## 2024-05-05 MED ORDER — CEPHALEXIN 500 MG PO CAPS
500.0000 mg | ORAL_CAPSULE | Freq: Four times a day (QID) | ORAL | 0 refills | Status: AC
Start: 2024-05-05 — End: 2024-05-12

## 2024-05-05 MED ORDER — IBUPROFEN 800 MG PO TABS
800.0000 mg | ORAL_TABLET | Freq: Once | ORAL | Status: AC
  Administered 2024-05-05: 800 mg via ORAL
  Filled 2024-05-05: qty 1

## 2024-05-05 MED ORDER — IBUPROFEN 800 MG PO TABS: 800.0000 mg | ORAL_TABLET | Freq: Three times a day (TID) | ORAL | 0 refills | Status: AC | PRN

## 2024-05-05 MED ORDER — DOXYCYCLINE HYCLATE 100 MG PO TABS: 100.0000 mg | ORAL_TABLET | Freq: Two times a day (BID) | ORAL | 0 refills | Status: DC

## 2024-05-05 NOTE — Discharge Instructions (Signed)
 You may alternate over the counter Tylenol 1000 mg every 6 hours as needed for pain, fever and Ibuprofen 800 mg every 6-8 hours as needed for pain, fever.  Please take Ibuprofen with food.  Do not take more than 4000 mg of Tylenol (acetaminophen) in a 24 hour period.

## 2024-05-05 NOTE — ED Provider Notes (Signed)
 [facility] Provider Note    Event Date/Time   First MD Initiated Contact with Patient 05/05/24 0133     (approximate)   History   Leg Swelling   HPI  Troy Spence is a 28 y.o. male with no significant past medical history who presents to the emergency department with pain, redness, swelling of the right lower extremity.  Has never had similar symptoms.  No fever No diabetes, not immunocompromise No vomiting No injury to the leg Is able to ambulate but causes discomfort. No history of DVT. No chest pain or shortness of breath.  History provided by patient, family.    History reviewed. No pertinent past medical history.  History reviewed. No pertinent surgical history.  MEDICATIONS:  Prior to Admission medications   Medication Sig Start Date End Date Taking? Authorizing Provider  methocarbamol  (ROBAXIN ) 500 MG tablet Take 1 tablet (500 mg total) by mouth every 6 (six) hours as needed for muscle spasms. 06/28/23   Saunders Shona LITTIE, PA-C  naproxen  (NAPROSYN ) 500 MG tablet Take 1 tablet (500 mg total) by mouth 2 (two) times daily with a meal. 06/28/23   Saunders Shona LITTIE, PA-C    Physical Exam   Triage Vital Signs: ED Triage Vitals  Encounter Vitals Group     BP 05/04/24 2346 113/64     Girls Systolic BP Percentile --      Girls Diastolic BP Percentile --      Boys Systolic BP Percentile --      Boys Diastolic BP Percentile --      Pulse Rate 05/04/24 2346 100     Resp 05/04/24 2346 18     Temp 05/04/24 2346 99 F (37.2 C)     Temp Source 05/04/24 2346 Oral     SpO2 05/04/24 2346 98 %     Weight 05/04/24 2345 235 lb (106.6 kg)     Height 05/04/24 2345 6' (1.829 m)     Head Circumference --      Peak Flow --      Pain Score 05/04/24 2345 10     Pain Loc --      Pain Education --      Exclude from Growth Chart --     Most recent vital signs: Vitals:   05/04/24 2346 05/05/24 0246  BP: 113/64 120/77  Pulse: 100 89  Resp: 18 18   Temp: 99 F (37.2 C) 98.9 F (37.2 C)  SpO2: 98% 99%    CONSTITUTIONAL: Alert, responds appropriately to questions. Well-appearing; well-nourished HEAD: Normocephalic, atraumatic EYES: Conjunctivae clear, pupils appear equal, sclera nonicteric ENT: normal nose; moist mucous membranes NECK: Supple, normal ROM CARD: RRR; S1 and S2 appreciated RESP: Normal chest excursion without splinting or tachypnea; breath sounds clear and equal bilaterally; no wheezes, no rhonchi, no rales, no hypoxia or respiratory distress, speaking full sentences ABD/GI: Non-distended; soft, non-tender, no rebound, no guarding, no peritoneal signs BACK: The back appears normal EXT: Normal ROM in all joints; no deformity noted, patient has increased redness, warmth to the soft tissues of the medial and posterior right calf.  There is no joint effusion.  2+ right DP pulse.  Compartments soft.  Normal sensation. SKIN: Normal color for age and race; warm; no rash on exposed skin NEURO: Moves all extremities equally, normal speech PSYCH: The patient's mood and manner are appropriate.   ED Results / Procedures / Treatments   LABS: (all labs ordered are listed, but only abnormal results  are displayed) Labs Reviewed  CBC WITH DIFFERENTIAL/PLATELET - Abnormal; Notable for the following components:      Result Value   MCV 78.9 (*)    All other components within normal limits  COMPREHENSIVE METABOLIC PANEL WITH GFR - Abnormal; Notable for the following components:   CO2 21 (*)    Glucose, Bld 163 (*)    Creatinine, Ser 1.48 (*)    Total Bilirubin 1.6 (*)    All other components within normal limits     EKG:  EKG Interpretation Date/Time:    Ventricular Rate:    PR Interval:    QRS Duration:    QT Interval:    QTC Calculation:   R Axis:      Text Interpretation:           RADIOLOGY: My personal review and interpretation of imaging: Ultrasound shows no DVT.  I have personally reviewed all  radiology reports.   US  Venous Img Lower Unilateral Right Result Date: 05/05/2024 EXAM: ULTRASOUND DUPLEX OF THE RIGHT LOWER EXTREMITY VEINS TECHNIQUE: Duplex ultrasound using B-mode/gray scaled imaging and Doppler spectral analysis and color flow was obtained of the deep venous structures of the right lower extremity. COMPARISON: None. CLINICAL HISTORY: Right leg pain. FINDINGS: The visualized veins of the lower extremity are patent and free of echogenic thrombus. The veins demonstrate good compressibility with normal color flow study and spectral analysis. 2 prominent hypervascular lymph nodes in the right groin measuring up to 3.9x1.4 cm. These demonstrate normal architecture and are favored reactive. Correlate for signs of infection in the right leg or lymphadenitis. IMPRESSION: 1. No evidence of DVT. 2. 2 prominent hypervascular lymph nodes in the right groin, favored reactive. Correlate for signs of infection in the right leg or lymphadenitis. Electronically signed by: Norman Gatlin MD 05/05/2024 01:21 AM EDT RP Workstation: [id number]     PROCEDURES:  Critical Care performed: No     Procedures    IMPRESSION / MDM / ASSESSMENT AND PLAN / ED COURSE  I reviewed the triage vital signs and the nursing notes.    Patient here with right lower extremity cellulitis.     DIFFERENTIAL DIAGNOSIS (includes but not limited to):   Cellulitis, no sign of abscess or gangrene, no sign of septic arthritis, DVT also on the differential   Patient's presentation is most consistent with acute presentation with potential threat to life or bodily function.   PLAN: Venous Doppler of the right leg obtained from triage and reviewed/interpreted by myself and the radiologist and shows no DVT.  He does have reactive inguinal lymph nodes likely from his infection.  No sign of abscess, necrotizing fasciitis, gangrene on exam.  Neurovascular intact distally.  Patient is otherwise young, healthy without history  of diabetes or other immunocompromise state.  He is not having any systemic symptoms.  I feel it is appropriate to start him on oral antibiotics and discussed supportive care instructions.  I do not think at this time he requires admission.  He is comfortable with this plan.  He declines anything for pain.  Will start Keflex , doxycycline  in the ED.   MEDICATIONS GIVEN IN ED: Medications  ibuprofen  (ADVIL ) tablet 800 mg (800 mg Oral Given 05/05/24 0239)  cephALEXin  (KEFLEX ) capsule 500 mg (500 mg Oral Given 05/05/24 0239)  doxycycline  (VIBRA -TABS) tablet 100 mg (100 mg Oral Given 05/05/24 0239)     ED COURSE:  At this time, I do not feel there is any life-threatening condition present. I reviewed all  nursing notes, vitals, pertinent previous records.  All lab and urine results, EKGs, imaging ordered have been independently reviewed and interpreted by myself.  I reviewed all available radiology reports from any imaging ordered this visit.  Based on my assessment, I feel the patient is safe to be discharged home without further emergent workup and can continue workup as an outpatient as needed. Discussed all findings, treatment plan as well as usual and customary return precautions.  They verbalize understanding and are comfortable with this plan.  Outpatient follow-up has been provided as needed.  All questions have been answered.    CONSULTS: Admission considered but patient ill-appearing without systemic symptoms, no compromise state.  Plan will be to discharge on oral antibiotics and if no improvement or worsening, strict return precautions to return to the ED.   OUTSIDE RECORDS REVIEWED: Reviewed last internal medicine note on 06/15/2020.       FINAL CLINICAL IMPRESSION(S) / ED DIAGNOSES   Final diagnoses:  Cellulitis of right lower extremity     Rx / DC Orders   ED Discharge Orders          Ordered    cephALEXin  (KEFLEX ) 500 MG capsule  4 times daily        05/05/24 0205     doxycycline  (VIBRA -TABS) 100 MG tablet  2 times daily        05/05/24 0205    ibuprofen  (ADVIL ) 800 MG tablet  Every 8 hours PRN        05/05/24 0205             Note:  This document was prepared using Dragon voice recognition software and may include unintentional dictation errors.   Hadja Harral, Josette SAILOR, DO 05/05/24 [phone]

## 2024-05-05 NOTE — ED Notes (Signed)
 Discharge instructions reviewed.   Newly prescribed medications discussed. Pharmacy verified.   Opportunity for questions and concerns provided.   Alert, oriented and ambulatory. Displays no signs of distress.

## 2024-05-18 ENCOUNTER — Emergency Department
Admission: EM | Admit: 2024-05-18 | Discharge: 2024-05-18 | Disposition: A | Discharge status: Home / Self Care | Payer: Self-pay | Attending: Emergency Medicine | Admitting: Emergency Medicine

## 2024-05-18 ENCOUNTER — Other Ambulatory Visit: Payer: Self-pay

## 2024-05-18 DIAGNOSIS — M7989 Other specified soft tissue disorders: Secondary | ICD-10-CM | POA: Insufficient documentation

## 2024-05-18 LAB — CBC WITH DIFFERENTIAL/PLATELET
Abs Immature Granulocytes: 0.02 K/uL (ref 0.00–0.07)
Basophils Absolute: 0.1 K/uL (ref 0.0–0.1)
Basophils Relative: 1 %
Eosinophils Absolute: 0.1 K/uL (ref 0.0–0.5)
Eosinophils Relative: 2 %
HCT: 41.9 % (ref 39.0–52.0)
Hemoglobin: 13.9 g/dL (ref 13.0–17.0)
Immature Granulocytes: 0 %
Lymphocytes Relative: 52 %
Lymphs Abs: 2.6 K/uL (ref 0.7–4.0)
MCH: 26.7 pg (ref 26.0–34.0)
MCHC: 33.2 g/dL (ref 30.0–36.0)
MCV: 80.6 fL (ref 80.0–100.0)
Monocytes Absolute: 0.3 K/uL (ref 0.1–1.0)
Monocytes Relative: 5 %
Neutro Abs: 2 K/uL (ref 1.7–7.7)
Neutrophils Relative %: 40 %
Platelets: 264 K/uL (ref 150–400)
RBC: 5.2 MIL/uL (ref 4.22–5.81)
RDW: 12.7 % (ref 11.5–15.5)
WBC: 5.1 K/uL (ref 4.0–10.5)
nRBC: 0 % (ref 0.0–0.2)

## 2024-05-18 LAB — BASIC METABOLIC PANEL WITH GFR
Anion gap: 11 (ref 5–15)
BUN: 18 mg/dL (ref 6–20)
CO2: 22 mmol/L (ref 22–32)
Calcium: 9.4 mg/dL (ref 8.9–10.3)
Chloride: 107 mmol/L (ref 98–111)
Creatinine, Ser: 1.07 mg/dL (ref 0.61–1.24)
GFR, Estimated: >60 mL/min (ref >60)
Glucose, Bld: 146 mg/dL — ABNORMAL HIGH (ref 70–99)
Potassium: 4 mmol/L (ref 3.5–5.1)
Sodium: 140 mmol/L (ref 135–145)

## 2024-05-18 LAB — D-DIMER, QUANTITATIVE: D-Dimer, Quant: 0.42 ug{FEU}/mL (ref 0.00–0.50)

## 2024-05-18 NOTE — Discharge Instructions (Signed)
 Please wear compression stockings and keep your leg elevated.  Please return the emergency department if you develop worsening pain, swelling, redness, chest pain, shortness of breath, fevers, chills, or any other concerns.  Please follow-up with the vascular surgeon for further evaluation as well as the dermatologist.  It was a pleasure caring for you today.

## 2024-05-18 NOTE — ED Provider Notes (Signed)
 [facility] Provider Note    Event Date/Time   First MD Initiated Contact with Patient 05/18/24 [phone]     (approximate)   History   Leg Injury   HPI  Troy Spence is a 28 y.o. male who presents today for evaluation of right leg swelling.  Patient reports that this began a couple of weeks ago.  He denies any injury.  He reports that he was seen here and given antibiotics and has not noticed any change.  He has athlete's foot bilaterally and has been scratching it.  He reports that he has been out of work for the last couple of weeks trying to heal this area.  He reports that he has intermittent pain and tightness in his calf.  No pain in his knee, hip, or groin area.  No shortness of breath or chest pain.  No personal or family history of blood clots.  There are no active problems to display for this patient.         Physical Exam   Triage Vital Signs: ED Triage Vitals  Encounter Vitals Group     BP 05/18/24 0742 (!) 148/91     Girls Systolic BP Percentile --      Girls Diastolic BP Percentile --      Boys Systolic BP Percentile --      Boys Diastolic BP Percentile --      Pulse Rate 05/18/24 0742 78     Resp 05/18/24 0742 16     Temp 05/18/24 0742 98.2 F (36.8 C)     Temp Source 05/18/24 0742 Oral     SpO2 05/18/24 0742 99 %     Weight 05/18/24 0743 240 lb (108.9 kg)     Height 05/18/24 0743 6' (1.829 m)     Head Circumference --      Peak Flow --      Pain Score 05/18/24 0742 0     Pain Loc --      Pain Education --      Exclude from Growth Chart --     Most recent vital signs: Vitals:   05/18/24 0742 05/18/24 0909  BP: (!) 148/91 (!) 142/88  Pulse: 78 74  Resp: 16 17  Temp: 98.2 F (36.8 C) 98.1 F (36.7 C)  SpO2: 99% 99%    Physical Exam Vitals and nursing note reviewed.  Constitutional:      General: Awake and alert. No acute distress.    Appearance: Normal appearance.  HENT:     Head: Normocephalic and atraumatic.      Mouth: Mucous membranes are moist.  Eyes:     General: PERRL. Normal EOMs        Right eye: No discharge.        Left eye: No discharge.     Conjunctiva/sclera: Conjunctivae normal.  Cardiovascular:     Rate and Rhythm: Normal rate and regular rhythm.     Pulses: Normal pulses.  Pulmonary:     Effort: Pulmonary effort is normal. No respiratory distress.     Breath sounds: Normal breath sounds.  Abdominal:     Abdomen is soft. There is no abdominal tenderness. No rebound or guarding. No distention. Musculoskeletal:        General: No swelling. Normal range of motion.     Cervical back: Normal range of motion and neck supple.  Right lower extremity with 1+ pitting edema, flaky skin to the anterior portion of the shin  extending into the ankle.  Normal 2+ pedal pulse.  No tenderness to palpation throughout foot, ankle, leg, knee.  Full and normal range of motion of foot, knee, hip.  No crepitus. Skin:    General: Skin is warm and dry.     Capillary Refill: Capillary refill takes less than 2 seconds.     Findings: No rash.  Neurological:     Mental Status: The patient is awake and alert.      ED Results / Procedures / Treatments   Labs (all labs ordered are listed, but only abnormal results are displayed) Labs Reviewed  BASIC METABOLIC PANEL WITH GFR - Abnormal; Notable for the following components:      Result Value   Glucose, Bld 146 (*)    All other components within normal limits  CBC WITH DIFFERENTIAL/PLATELET  D-DIMER, QUANTITATIVE     EKG     RADIOLOGY     PROCEDURES:  Critical Care performed:   Procedures   MEDICATIONS ORDERED IN ED: Medications - No data to display   IMPRESSION / MDM / ASSESSMENT AND PLAN / ED COURSE  I reviewed the triage vital signs and the nursing notes.   Differential diagnosis includes, but is not limited to, dependent edema, cellulitis, DVT, venous stasis, Baker's cyst, cellulitis.  I reviewed the patient's chart.   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  He was also seen on 05/05/2024 and diagnosed with a cellulitis of his right lower extremity.  On his exam he had increased redness and warmth to the soft tissues of the medial and posterior right calf, and also had an ultrasound that was negative for DVT though did reveal reactive lymph nodes in his groin.  He had a normal white blood cell count at that time.  Patient is awake and alert, hemodynamically stable and afebrile.  She is nontoxic in appearance.  No tachycardia, hypoxia, shortness of breath, or pleurisy to suggest PE.  No shortness of breath, dyspnea on exertion, orthopnea, crackles, JVD, or bilateral lower extremity edema to suggest volume overload.  Labs repeated today are overall reassuring.  No leukocytosis.  Negative D-dimer, do not suspect DVT especially in the setting of recent negative ultrasound.  There is no warmth or erythema.  No lymphangitis.  No lymphadenopathy palpated in the groin.  Not suspicious for cellulitis at this time.  He has full and normal range of motion of all of his joints.  In the absence of infection, DVT, or Baker's cyst noted on previous ultrasound, most likely etiology is dependent edema.  He is completely nontender throughout.  Compartments are soft compressible throughout, not consistent with compartment syndrome and he has normal pedal pulses, sensation is intact and equal to opposite.  We discussed compression stockings and I recommended outpatient follow-up with vascular surgery for further evaluation and he was given the appropriate follow-up information.  Given his athlete's foot he requested the information for dermatology as well which was provided for him.  We discussed strict return precautions and the importance of close outpatient follow-up.  Patient understands and agrees with plan.  He was discharged in stable  condition.  He and his significant other both requested work note which were provided.  Patient's presentation is most consistent with acute complicated illness / injury requiring diagnostic workup.      FINAL CLINICAL IMPRESSION(S) / ED DIAGNOSES   Final diagnoses:  Right leg swelling     Rx / DC Orders   ED Discharge Orders     None        Note:  This document was prepared using Dragon voice recognition software and may include unintentional dictation errors.   Denyce Harr E, PA-C 05/18/24 1030    Arlander Charleston, MD 05/18/24 1200

## 2024-05-18 NOTE — ED Triage Notes (Signed)
 Pt arrives via POV with c/o right leg pain. Pt states that they were seen here a couple of weeks ago for the same. Pt's right leg is swollen and states that there is pain every once in a while. Pt is A&Ox4 and ambulatory during triage.
